# Patient Record
Sex: Female | Born: 1985 | Race: White | Hispanic: No | Marital: Married | Smoking: Current every day smoker
Health system: Southern US, Community
[De-identification: ages and names within clinical notes are randomized; demographics above are authoritative.]

---

## 2013-07-25 ENCOUNTER — Emergency Department (HOSPITAL_COMMUNITY)
Admission: EM | Admit: 2013-07-25 | Discharge: 2013-07-25 | Disposition: A | Discharge status: Home / Self Care | Payer: Self-pay | Attending: Emergency Medicine | Admitting: Emergency Medicine

## 2013-07-25 ENCOUNTER — Encounter (HOSPITAL_COMMUNITY): Payer: Self-pay | Admitting: Emergency Medicine

## 2013-07-25 DIAGNOSIS — Z79899 Other long term (current) drug therapy: Secondary | ICD-10-CM | POA: Insufficient documentation

## 2013-07-25 DIAGNOSIS — F17200 Nicotine dependence, unspecified, uncomplicated: Secondary | ICD-10-CM | POA: Insufficient documentation

## 2013-07-25 DIAGNOSIS — J209 Acute bronchitis, unspecified: Secondary | ICD-10-CM | POA: Insufficient documentation

## 2013-07-25 DIAGNOSIS — J40 Bronchitis, not specified as acute or chronic: Secondary | ICD-10-CM

## 2013-07-25 MED ORDER — AZITHROMYCIN 250 MG PO TABS
500.0000 mg | ORAL_TABLET | Freq: Once | ORAL | Status: AC
  Administered 2013-07-25: 500 mg via ORAL
  Filled 2013-07-25: qty 2

## 2013-07-25 MED ORDER — AZITHROMYCIN 250 MG PO TABS: ORAL_TABLET | ORAL | Status: DC

## 2013-07-25 MED ORDER — ALBUTEROL SULFATE HFA 108 (90 BASE) MCG/ACT IN AERS: 2.0000 | INHALATION_SPRAY | RESPIRATORY_TRACT | Status: DC | PRN

## 2013-07-25 MED ORDER — ALBUTEROL SULFATE HFA 108 (90 BASE) MCG/ACT IN AERS
2.0000 | INHALATION_SPRAY | Freq: Once | RESPIRATORY_TRACT | Status: AC
  Administered 2013-07-25: 2 via RESPIRATORY_TRACT
  Filled 2013-07-25: qty 6.7

## 2013-07-25 NOTE — ED Notes (Signed)
Pt c/o URI sx with cough and congestion x 1 week

## 2013-07-25 NOTE — ED Provider Notes (Signed)
CSN: [id number]     Arrival date & time 07/25/13  1507 History  This chart was scribed for non-physician practitioner, Wylene Simmer PA-C,  working with Gilda Crease, * by Arlan Organ, ED Scribe. This patient was seen in room TR11C/TR11C and the patient's care was started at 3:53 PM.   Chief Complaint  Patient presents with  . URI  . Nasal Congestion  . Cough   Patient is a 27 y.o. female presenting with URI and cough. The history is provided by the patient. No language interpreter was used.  URI Presenting symptoms: congestion and cough   Severity:  Moderate Onset quality:  Gradual Duration:  1 week Timing:  Constant Progression:  Waxing and waning Chronicity:  Recurrent Relieved by:  OTC medications and inhaler Associated symptoms: wheezing   Cough Associated symptoms: wheezing   HPI Comments: Rhonda Love is a 27 y.o. female who presents to the Emergency Department complaining of a URI that started a week ago. Pt states she feels her wisdom teeth are cutting through her sinus canal, which result in frequent URI's. Pt states she has also experienced associated green and brown colored sputum and nasal congestion. She says she has tried Nyquil, tylenol, and albuterol with some relief. Pt states she is a smoker, but has not been smoking much in the past week.   History reviewed. No pertinent past medical history. History reviewed. No pertinent past surgical history. History reviewed. No pertinent family history. History  Substance Use Topics  . Smoking status: Current Every Day Smoker  . Smokeless tobacco: Not on file  . Alcohol Use: Yes     Comment: occ   OB History   Grav Para Term Preterm Abortions TAB SAB Ect Mult Living                 Review of Systems  HENT: Positive for congestion.   Respiratory: Positive for cough and wheezing.   All other systems reviewed and are negative.    Allergies  Review of patient's allergies indicates no known  allergies.  Home Medications   Current Outpatient Rx  Name  Route  Sig  Dispense  Refill  . acetaminophen (TYLENOL) 325 MG tablet   Oral   Take 650 mg by mouth every 6 (six) hours as needed for pain.         Marland Kitchen albuterol (PROVENTIL HFA;VENTOLIN HFA) 108 (90 BASE) MCG/ACT inhaler   Inhalation   Inhale 2 puffs into the lungs every 6 (six) hours as needed for wheezing.         Marland Kitchen DM-Doxylamine-Acetaminophen 15-6.25-325 MG/15ML LIQD   Oral   Take 30 mLs by mouth every 6 (six) hours as needed (for congestion).          . Multiple Vitamin (MULTIVITAMIN WITH MINERALS) TABS tablet   Oral   Take 1 tablet by mouth daily.          BP 134/86  Pulse 81  Temp(Src) 98.4 F (36.9 C) (Oral)  Resp 18  SpO2 97% Physical Exam  Nursing note and vitals reviewed. Constitutional: She is oriented to person, place, and time. She appears well-developed and well-nourished.  HENT:  Head: Normocephalic and atraumatic.  Right Ear: Tympanic membrane normal.  Left Ear: Tympanic membrane normal.  No frontal sinus pain No maxillary sinus pain No cerumen impaction Mild erythema  post pharynx Nasal mucosa boggy  Eyes: EOM are normal.  Neck: Normal range of motion.  Cardiovascular: Normal rate.   Pulmonary/Chest: Effort  normal. She has wheezes.  Left upper lobal expiratory wheeze noted  Musculoskeletal: Normal range of motion.  Lymphadenopathy:    She has no cervical adenopathy.  Neurological: She is alert and oriented to person, place, and time.  Skin: Skin is warm and dry.  Psychiatric: She has a normal mood and affect. Her behavior is normal.    ED Course  Procedures (including critical care time)  DIAGNOSTIC STUDIES: Oxygen Saturation is 97% on RA, Normal by my interpretation.    COORDINATION OF CARE: 4:32 PM- Will prescribed medication for bronchitis. Discussed treatment plan with pt at bedside and pt agreed to plan.      Labs Review Labs Reviewed - No data to display Imaging  Review No results found.  MDM  Bronchitis  Patient here with a week history of URI and sinus symptoms.  States has used friends inhaler and this felt better - no dysnpea or hypoxia noted here - will start on zithromax and inhaler.  I personally performed the services described in this documentation, which was scribed in my presence. The recorded information has been reviewed and is accurate.   Izola Price Marisue Humble, [place] 07/25/13 1634

## 2013-07-28 NOTE — ED Provider Notes (Signed)
Medical screening examination/treatment/procedure(s) were performed by non-physician practitioner and as supervising physician I was immediately available for consultation/collaboration.    Christopher J. Pollina, MD 07/28/13 0432 

## 2013-08-02 ENCOUNTER — Encounter (HOSPITAL_COMMUNITY): Payer: Self-pay | Admitting: Nurse Practitioner

## 2013-08-02 ENCOUNTER — Emergency Department (HOSPITAL_COMMUNITY)
Admission: EM | Admit: 2013-08-02 | Discharge: 2013-08-02 | Disposition: A | Discharge status: Home / Self Care | Payer: Self-pay | Attending: Emergency Medicine | Admitting: Emergency Medicine

## 2013-08-02 DIAGNOSIS — S0990XA Unspecified injury of head, initial encounter: Secondary | ICD-10-CM | POA: Insufficient documentation

## 2013-08-02 DIAGNOSIS — W010XXA Fall on same level from slipping, tripping and stumbling without subsequent striking against object, initial encounter: Secondary | ICD-10-CM | POA: Insufficient documentation

## 2013-08-02 DIAGNOSIS — W19XXXA Unspecified fall, initial encounter: Secondary | ICD-10-CM

## 2013-08-02 DIAGNOSIS — F17200 Nicotine dependence, unspecified, uncomplicated: Secondary | ICD-10-CM | POA: Insufficient documentation

## 2013-08-02 DIAGNOSIS — Y92009 Unspecified place in unspecified non-institutional (private) residence as the place of occurrence of the external cause: Secondary | ICD-10-CM | POA: Insufficient documentation

## 2013-08-02 DIAGNOSIS — Y9301 Activity, walking, marching and hiking: Secondary | ICD-10-CM | POA: Insufficient documentation

## 2013-08-02 DIAGNOSIS — Z79899 Other long term (current) drug therapy: Secondary | ICD-10-CM | POA: Insufficient documentation

## 2013-08-02 MED ORDER — IBUPROFEN 400 MG PO TABS
800.0000 mg | ORAL_TABLET | Freq: Once | ORAL | Status: AC
  Administered 2013-08-02: 800 mg via ORAL
  Filled 2013-08-02: qty 2

## 2013-08-02 NOTE — ED Notes (Signed)
Pt slipped and struck left mastoid area on the stair rale. Denies LOC, no N/V. Slight bruising noted.

## 2013-08-02 NOTE — ED Notes (Signed)
Pt slid on wet floor and hit L side of head on railing. C/o pain at site now. No bruising noted. Denies loc. A&Ox4. Also requesting a pregnancy test.
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
## 2013-08-02 NOTE — ED Notes (Signed)
Pt  Discharged.Vital signs stable and GCS 15

## 2013-08-03 NOTE — ED Provider Notes (Signed)
Medical screening examination/treatment/procedure(s) were performed by non-physician practitioner and as supervising physician I was immediately available for consultation/collaboration.   Junius Argyle, MD 08/03/13 1220

## 2013-09-14 DIAGNOSIS — Z880 Allergy status to penicillin: Secondary | ICD-10-CM | POA: Insufficient documentation

## 2013-09-14 DIAGNOSIS — R110 Nausea: Secondary | ICD-10-CM | POA: Insufficient documentation

## 2013-09-14 DIAGNOSIS — Z3202 Encounter for pregnancy test, result negative: Secondary | ICD-10-CM | POA: Insufficient documentation

## 2013-09-14 DIAGNOSIS — N946 Dysmenorrhea, unspecified: Secondary | ICD-10-CM | POA: Insufficient documentation

## 2013-09-14 DIAGNOSIS — F17200 Nicotine dependence, unspecified, uncomplicated: Secondary | ICD-10-CM | POA: Insufficient documentation

## 2013-09-14 NOTE — ED Notes (Signed)
Pt. reports intermittent vaginal bleeding/clots with low abdominal cramping onset Nov. 2 last week , denies injury or urinary discomfort.

## 2013-09-15 ENCOUNTER — Emergency Department (HOSPITAL_COMMUNITY): Payer: Self-pay

## 2013-09-15 ENCOUNTER — Emergency Department (HOSPITAL_COMMUNITY)
Admission: EM | Admit: 2013-09-15 | Discharge: 2013-09-15 | Disposition: A | Discharge status: Home / Self Care | Payer: Self-pay | Attending: Emergency Medicine | Admitting: Emergency Medicine

## 2013-09-15 ENCOUNTER — Encounter (HOSPITAL_COMMUNITY): Payer: Self-pay | Admitting: Emergency Medicine

## 2013-09-15 DIAGNOSIS — N946 Dysmenorrhea, unspecified: Secondary | ICD-10-CM

## 2013-09-15 LAB — URINE MICROSCOPIC-ADD ON

## 2013-09-15 LAB — POCT I-STAT, CHEM 8
BUN: 13 mg/dL (ref 6–23)
Calcium, Ion: 1.21 mmol/L (ref 1.12–1.23)
Creatinine, Ser: 1 mg/dL (ref 0.50–1.10)
Glucose, Bld: 91 mg/dL (ref 70–99)
Hemoglobin: 13.9 g/dL (ref 12.0–15.0)
Potassium: 3.3 mEq/L — ABNORMAL LOW (ref 3.5–5.1)
Sodium: 139 mEq/L (ref 135–145)

## 2013-09-15 LAB — URINALYSIS, ROUTINE W REFLEX MICROSCOPIC
Bilirubin Urine: NEGATIVE
Glucose, UA: NEGATIVE mg/dL
Protein, ur: NEGATIVE mg/dL
Specific Gravity, Urine: 1.035 — ABNORMAL HIGH (ref 1.005–1.030)
Urobilinogen, UA: 1 mg/dL (ref 0.0–1.0)

## 2013-09-15 LAB — WET PREP, GENITAL: Trich, Wet Prep: NONE SEEN

## 2013-09-15 MED ORDER — TRAMADOL HCL 50 MG PO TABS: 50.0000 mg | ORAL_TABLET | Freq: Four times a day (QID) | ORAL | Status: DC | PRN

## 2013-09-15 MED ORDER — OXYCODONE-ACETAMINOPHEN 5-325 MG PO TABS
2.0000 | ORAL_TABLET | Freq: Once | ORAL | Status: AC
  Administered 2013-09-15: 2 via ORAL
  Filled 2013-09-15: qty 2

## 2013-09-15 NOTE — ED Provider Notes (Signed)
CSN: [id number]     Arrival date & time 09/14/13  2334 History   First MD Initiated Contact with Patient 09/15/13 0315     Chief Complaint  Patient presents with  . Vaginal Bleeding  . Abdominal Cramping   (Consider location/radiation/quality/duration/timing/severity/associated sxs/prior Treatment) HPI Comments: Patient is a 27 y/o female who presents for intermittent vaginal bleeding with associated lower abdominal pain. Patient states that one week ago she experienced you can see vaginal discharge next with dark brown/red blood. This persisted for 4 days before spontaneously resolving. Patient states she went 2 days without symptoms and then noticed a large red clot in the toilet bowel after urinating 3 days ago. Patient has been having intermittent vaginal bleeding since this time with abdominal pain that waxes and wanes in severity. Patient is to some mild associated nausea. She denies associated fever, chest pain or shortness of breath, dysuria or hematuria, diarrhea, melena or hematochezia, and numbness or tingling. Patient's last known menstrual period was 07/16/2013.  Patient is a 27 y.o. female presenting with vaginal bleeding and cramps. The history is provided by the patient. No language interpreter was used.  Vaginal Bleeding Associated symptoms: abdominal pain (cramping) and nausea   Associated symptoms: no dysuria and no fever   Abdominal Cramping Associated symptoms include abdominal pain (cramping) and nausea. Pertinent negatives include no chest pain, fever or vomiting.    History reviewed. No pertinent past medical history. Past Surgical History  Procedure Laterality Date  . Cesarean section  11/2011   No family history on file. History  Substance Use Topics  . Smoking status: Current Every Day Smoker  . Smokeless tobacco: Not on file  . Alcohol Use: Yes     Comment: occ   OB History   Grav Para Term Preterm Abortions TAB SAB Ect Mult Living                  Review of Systems  Constitutional: Negative for fever.  Respiratory: Negative for shortness of breath.   Cardiovascular: Negative for chest pain.  Gastrointestinal: Positive for nausea and abdominal pain (cramping). Negative for vomiting.  Genitourinary: Positive for vaginal bleeding. Negative for dysuria and hematuria.  All other systems reviewed and are negative.    Allergies  Penicillins  Home Medications   Current Outpatient Rx  Name  Route  Sig  Dispense  Refill  . Multiple Vitamin (MULTIVITAMIN WITH MINERALS) TABS tablet   Oral   Take 1 tablet by mouth daily.         . traMADol (ULTRAM) 50 MG tablet   Oral   Take 1 tablet (50 mg total) by mouth every 6 (six) hours as needed.   7 tablet   0    BP 87/67  Pulse 58  Temp(Src) 98.1 F (36.7 C) (Oral)  Resp 18  Wt 279 lb (126.554 kg)  SpO2 95%  LMP 07/16/2013  Physical Exam  Nursing note and vitals reviewed. Constitutional: She is oriented to person, place, and time. She appears well-developed and well-nourished. No distress.  HENT:  Head: Normocephalic and atraumatic.  Eyes: Conjunctivae and EOM are normal. Pupils are equal, round, and reactive to light. No scleral icterus.  Neck: Normal range of motion.  Cardiovascular: Normal rate, regular rhythm and normal heart sounds.   Pulmonary/Chest: Effort normal. No respiratory distress. She has no wheezes. She has no rales.  Abdominal: Soft. She exhibits no distension. There is tenderness (suprapubic). There is no rebound and no guarding.  No peritoneal  signs or evidence of acute surgical abdomen  Genitourinary: Vagina normal. There is no rash, tenderness, lesion or injury on the right labia. There is no rash, tenderness, lesion or injury on the left labia. Uterus is tender (mild). Cervix exhibits no motion tenderness, no discharge and no friability. Right adnexum displays no mass, no tenderness and no fullness. Left adnexum displays tenderness. Left adnexum displays  no mass and no fullness.  Musculoskeletal: Normal range of motion.  Neurological: She is alert and oriented to person, place, and time.  Skin: Skin is warm and dry. No rash noted. She is not diaphoretic. No erythema. No pallor.  Psychiatric: She has a normal mood and affect. Her behavior is normal.    ED Course  Procedures (including critical care time) Labs Review Labs Reviewed  WET PREP, GENITAL - Abnormal; Notable for the following:    Clue Cells Wet Prep HPF POC FEW (*)    WBC, Wet Prep HPF POC FEW (*)    All other components within normal limits  URINALYSIS, ROUTINE W REFLEX MICROSCOPIC - Abnormal; Notable for the following:    APPearance CLOUDY (*)    Specific Gravity, Urine 1.035 (*)    Hgb urine dipstick MODERATE (*)    Ketones, ur 15 (*)    All other components within normal limits  URINE MICROSCOPIC-ADD ON - Abnormal; Notable for the following:    Crystals CA OXALATE CRYSTALS (*)    All other components within normal limits  POCT I-STAT, CHEM 8 - Abnormal; Notable for the following:    Potassium 3.3 (*)    All other components within normal limits  GC/CHLAMYDIA PROBE AMP  POCT PREGNANCY, URINE   Imaging Review US Transvaginal Non-ob  09/15/2013   CLINICAL DATA:  Vaginal bleeding and abdominal pain.  EXAM: TRANSABDOMINAL AND TRANSVAGINAL ULTRASOUND OF PELVIS  TECHNIQUE: Both transabdominal and transvaginal ultrasound examinations of the pelvis were performed. Transabdominal technique was performed for global imaging of the pelvis including uterus, ovaries, adnexal regions, and pelvic cul-de-sac. It was necessary to proceed with endovaginal exam following the transabdominal exam to visualize the uterus and ovaries in greater detail.  COMPARISON:  None  FINDINGS: Uterus  Measurements: 10.2 x 3.7 x 6.0 cm. No fibroids or other mass visualized.  Endometrium  Thickness: 1.0 cm.  No focal abnormality visualized.  Right ovary  Measurements: 3.2 x 3.0 x 2.0 cm. Normal appearance/no  adnexal mass.  Left ovary  Measurements: 3.2 x 2.6 x 2.6 cm. Normal appearance/no adnexal mass.  Other findings  No free fluid seen within the pelvic cul-de-sac.  IMPRESSION: Unremarkable pelvic ultrasound.   Electronically Signed   By: Roanna Raider M.D.   On: 09/15/2013 05:09   US Pelvis Complete  09/15/2013   CLINICAL DATA:  Vaginal bleeding and abdominal pain.  EXAM: TRANSABDOMINAL AND TRANSVAGINAL ULTRASOUND OF PELVIS  TECHNIQUE: Both transabdominal and transvaginal ultrasound examinations of the pelvis were performed. Transabdominal technique was performed for global imaging of the pelvis including uterus, ovaries, adnexal regions, and pelvic cul-de-sac. It was necessary to proceed with endovaginal exam following the transabdominal exam to visualize the uterus and ovaries in greater detail.  COMPARISON:  None  FINDINGS: Uterus  Measurements: 10.2 x 3.7 x 6.0 cm. No fibroids or other mass visualized.  Endometrium  Thickness: 1.0 cm.  No focal abnormality visualized.  Right ovary  Measurements: 3.2 x 3.0 x 2.0 cm. Normal appearance/no adnexal mass.  Left ovary  Measurements: 3.2 x 2.6 x 2.6 cm. Normal appearance/no adnexal  mass.  Other findings  No free fluid seen within the pelvic cul-de-sac.  IMPRESSION: Unremarkable pelvic ultrasound.   Electronically Signed   By: Roanna Raider M.D.   On: 09/15/2013 05:09    EKG Interpretation   None       MDM   1. Dysmenorrhea    Patient presents for intermittent vaginal bleeding with lower abdominal cramping. Physical exam today significant for mild suprapubic tenderness without peritoneal signs or guarding. Patient on nontoxic appearing, hemodynamically stable, and afebrile. Workup today is unremarkable and urinalysis nonsuggestive of infection. Pelvic ultrasound ordered for further evaluation of symptoms today which is normal and without acute findings. Pain well controlled in ED with by mouth Percocet. Believes symptoms to be associated with  dysmenorrhea. Patient stable and appropriate for discharge with OB/GYN followup for further evaluation of her symptoms. Ultram prescribed as needed for pain control. Return precautions discussed and patient agreeable to plan with no unaddressed concerns.    Antony Madura, PA-C 09/16/13 0730

## 2013-09-15 NOTE — ED Notes (Signed)
Pt states that she has been having heavy vaginal bleeding for the past 2 weeks and she started having lighter bleeding today. Pt states that she had a clot that came out a couple of days ago and then had less pain in her pelvis. Pt worried about amount of blood loss without her regular period.

## 2013-09-15 NOTE — ED Notes (Signed)
Patient transported to Ultrasound 

## 2013-09-16 LAB — GC/CHLAMYDIA PROBE AMP: GC Probe RNA: NEGATIVE

## 2013-09-17 NOTE — ED Provider Notes (Signed)
Medical screening examination/treatment/procedure(s) were performed by non-physician practitioner and as supervising physician I was immediately available for consultation/collaboration.  Porshia Blizzard, MD 09/17/13 0328 

## 2013-10-14 ENCOUNTER — Ambulatory Visit (INDEPENDENT_AMBULATORY_CARE_PROVIDER_SITE_OTHER): Payer: Self-pay | Admitting: Medical

## 2013-10-14 ENCOUNTER — Encounter: Payer: Self-pay | Admitting: Medical

## 2013-10-14 VITALS — BP 143/99 | HR 98 | Temp 96.9°F | Ht 65.0 in | Wt 269.8 lb

## 2013-10-14 DIAGNOSIS — N926 Irregular menstruation, unspecified: Secondary | ICD-10-CM

## 2013-10-14 DIAGNOSIS — N946 Dysmenorrhea, unspecified: Secondary | ICD-10-CM

## 2013-10-14 DIAGNOSIS — E282 Polycystic ovarian syndrome: Secondary | ICD-10-CM

## 2013-10-14 LAB — CBC
HCT: 42 % (ref 36.0–46.0)
Hemoglobin: 14.6 g/dL (ref 12.0–15.0)
MCH: 30.2 pg (ref 26.0–34.0)
MCV: 86.8 fL (ref 78.0–100.0)
Platelets: 280 10*3/uL (ref 150–400)
RBC: 4.84 MIL/uL (ref 3.87–5.11)
WBC: 10.7 10*3/uL — ABNORMAL HIGH (ref 4.0–10.5)

## 2013-10-14 LAB — HEMOGLOBIN A1C: Mean Plasma Glucose: 100 mg/dL (ref <117)

## 2013-10-14 LAB — TSH: TSH: 2.635 u[IU]/mL (ref 0.350–4.500)

## 2013-10-14 MED ORDER — TRAMADOL HCL 50 MG PO TABS: 50.0000 mg | ORAL_TABLET | Freq: Four times a day (QID) | ORAL | Status: DC | PRN

## 2013-10-14 MED ORDER — METFORMIN HCL 500 MG PO TABS: 1000.0000 mg | ORAL_TABLET | Freq: Two times a day (BID) | ORAL | Status: AC

## 2013-10-14 NOTE — Patient Instructions (Addendum)
Polycystic Ovarian Syndrome Polycystic ovarian syndrome is a condition with a number of problems. One problem is with the ovaries. The ovaries are organs located in the female pelvis, on each side of the uterus. Usually, during the menstrual cycle, an egg is released from 1 ovary every month. This is called ovulation. When the egg is fertilized, it goes into the womb (uterus), which allows for the growth of a baby. The egg travels from the ovary through the fallopian tube to the uterus. The ovaries also make the hormones estrogen and progesterone. These hormones help the development of a woman's breasts, body shape, and body hair. They also regulate the menstrual cycle and pregnancy. Sometimes, cysts form in the ovaries. A cyst is a fluid-filled sac. On the ovary, different types of cysts can form. The most common type of ovarian cyst is called a functional or ovulation cyst. It is normal, and often forms during the normal menstrual cycle. Each month, a woman's ovaries grow tiny cysts that hold the eggs. When an egg is fully grown, the sac breaks open. This releases the egg. Then, the sac which released the egg from the ovary dissolves. In one type of functional cyst, called a follicle cyst, the sac does not break open to release the egg. It may actually continue to grow. This type of cyst usually disappears within 1 to 3 months.  One type of cyst problem with the ovaries is called Polycystic Ovarian Syndrome (PCOS). In this condition, many follicle cysts form, but do not rupture and produce an egg. This health problem can affect the following:  Menstrual cycle.  Heart.  Obesity.  Cancer of the uterus.  Fertility.  Blood vessels.  Hair growth (face and body) or baldness.  Hormones.  Appearance.  High blood pressure.  Stroke.  Insulin production.  Inflammation of the liver.  Elevated blood cholesterol and triglycerides. CAUSES   No one knows the exact cause of PCOS.  Women with  PCOS often have a mother or sister with PCOS. There is not yet enough proof to say this is inherited.  Many women with PCOS have a weight problem.  Researchers are looking at the relationship between PCOS and the body's ability to make insulin. Insulin is a hormone that regulates the change of sugar, starches, and other food into energy for the body's use, or for storage. Some women with PCOS make too much insulin. It is possible that the ovaries react by making too many female hormones, called androgens. This can lead to acne, excessive hair growth, weight gain, and ovulation problems.  Too much production of luteinizing hormone (LH) from the pituitary gland in the brain stimulates the ovary to produce too much female hormone (androgen). SYMPTOMS   Infrequent or no menstrual periods, and/or irregular bleeding.  Inability to get pregnant (infertility), because of not ovulating.  Increased growth of hair on the face, chest, stomach, back, thumbs, thighs, or toes.  Acne, oily skin, or dandruff.  Pelvic pain.  Weight gain or obesity, usually carrying extra weight around the waist.  Type 2 diabetes (this is the diabetes that usually does not need insulin).  High cholesterol.  High blood pressure.  Female-pattern baldness or thinning hair.  Patches of thickened and dark brown or black skin on the neck, arms, breasts, or thighs.  Skin tags, or tiny excess flaps of skin, in the armpits or neck area.  Sleep apnea (excessive snoring and breathing stops at times while asleep).  Deepening of the voice.  Gestational diabetes when pregnant.  Increased risk of miscarriage with pregnancy. DIAGNOSIS  There is no single test to diagnose PCOS.   Your caregiver will:  Take a medical history.  Perform a pelvic exam.  Perform an ultrasound.  Check your female and female hormone levels.  Measure glucose or sugar levels in the blood.  Do other blood tests.  If you are producing too many  female hormones, your caregiver will make sure it is from PCOS. At the physical exam, your caregiver will want to evaluate the areas of increased hair growth. Try to allow natural hair growth for a few days before the visit.  During a pelvic exam, the ovaries may be enlarged or swollen by the increased number of small cysts. This can be seen more easily by vaginal ultrasound or screening, to examine the ovaries and lining of the uterus (endometrium) for cysts. The uterine lining may become thicker, if there has not been a regular period. TREATMENT  Because there is no cure for PCOS, it needs to be managed to prevent problems. Treatments are based on your symptoms. Treatment is also based on whether you want to have a baby or whether you need contraception.  Treatment may include:  Progesterone hormone, to start a menstrual period.  Birth control pills, to make you have regular menstrual periods.  Medicines to make you ovulate, if you want to get pregnant.  Medicines to control your insulin.  Medicine to control your blood pressure.  Medicine and diet, to control your high cholesterol and triglycerides in your blood.  Surgery, making small holes in the ovary, to decrease the amount of female hormone production. This is done through a long, lighted tube (laparoscope), placed into the pelvis through a tiny incision in the lower abdomen. Your caregiver will go over some of the choices with you. WOMEN WITH PCOS HAVE THESE CHARACTERISTICS:  High levels of female hormones called androgens.  An irregular or no menstrual cycle.  May have many small cysts in their ovaries. PCOS is the most common hormonal reproductive problem in women of childbearing age. WHY DO WOMEN WITH PCOS HAVE TROUBLE WITH THEIR MENSTRUAL CYCLE? Each month, about 20 eggs start to mature in the ovaries. As one egg grows and matures, the follicle breaks open to release the egg, so it can travel through the fallopian tube for  fertilization. When the single egg leaves the follicle, ovulation takes place. In women with PCOS, the ovary does not make all of the hormones it needs for any of the eggs to fully mature. They may start to grow and accumulate fluid, but no one egg becomes large enough. Instead, some may remain as cysts. Since no egg matures or is released, ovulation does not occur and the hormone progesterone is not made. Without progesterone, a woman's menstrual cycle is irregular or absent. Also, the cysts produce female hormones, which continue to prevent ovulation.  Document Released: 02/14/2005 Document Revised: 01/13/2012 Document Reviewed: 04/08/2013 [facility] Patient Information 2014 [place], [place].   Metformin Taper Dosages: 500 mg in the morning x 1 week 500 mg in the morning and 500 mg in the afternoon x 1 week 1000 mg in the morning and 500 mg in the afternoon x 1 week 1000 mg in the morning and 1000 mg in the afternoon x 1 week

## 2013-10-14 NOTE — Progress Notes (Signed)
Patient ID: Rhonda Love, female   DOB: [birth year], 27 y.o.   MRN: [id number]  History:  Rhonda Love is a 27 y.o. G1P1001 who presents to clinic today for abnormal uterine bleeding. The patient states she had heavy bleeding most days x 3 weeks in November. The patient states that she had a history of similar bleeding patterns as a teenager and again just after the birth of her child. Patient states that she had difficulties conceiving with her last child and has been trying again now x 2 years without success. Patient is not bleeding today. She denies bleeding x 2 weeks. Patient denies pain, vaginal discharge, dizziness, weakness or fatigue today. The patient states that since the birth of her child she has a cycle q 60 days.   The following portions of the patient's history were reviewed and updated as appropriate: allergies, current medications, past family history, past medical history, past social history, past surgical history and problem list.  Review of Systems:  Pertinent items are noted in HPI.  Objective:  Physical Exam BP 143/99  Pulse 98  Temp(Src) 96.9 F (36.1 C) (Oral)  Ht 5\' 5"  (1.651 m)  Wt 269 lb 12.8 oz (122.38 kg)  BMI 44.90 kg/m2  LMP 09/05/2013 GENERAL: Well-developed, well-nourished female in no acute distress.  HEENT: Normocephalic, atraumatic. Hirsutism.  LUNGS: Normal rate. Clear to auscultation bilaterally.  HEART: Regular rate and rhythm with no adventitious sounds.  ABDOMEN: Soft, nontender, nondistended. No organomegaly. Normal bowel sounds appreciated in all quadrants.  EXTREMITIES: No cyanosis, clubbing, or edema PSYCH: Normal mood and affect   Labs and Imaging US Transvaginal Non-ob  09/15/2013   CLINICAL DATA:  Vaginal bleeding and abdominal pain.  EXAM: TRANSABDOMINAL AND TRANSVAGINAL ULTRASOUND OF PELVIS  TECHNIQUE: Both transabdominal and transvaginal ultrasound examinations of the pelvis were performed. Transabdominal technique was performed  for global imaging of the pelvis including uterus, ovaries, adnexal regions, and pelvic cul-de-sac. It was necessary to proceed with endovaginal exam following the transabdominal exam to visualize the uterus and ovaries in greater detail.  COMPARISON:  None  FINDINGS: Uterus  Measurements: 10.2 x 3.7 x 6.0 cm. No fibroids or other mass visualized.  Endometrium  Thickness: 1.0 cm.  No focal abnormality visualized.  Right ovary  Measurements: 3.2 x 3.0 x 2.0 cm. Normal appearance/no adnexal mass.  Left ovary  Measurements: 3.2 x 2.6 x 2.6 cm. Normal appearance/no adnexal mass.  Other findings  No free fluid seen within the pelvic cul-de-sac.  IMPRESSION: Unremarkable pelvic ultrasound.   Electronically Signed   By: Roanna Raider M.D.   On: 09/15/2013 05:09   US Pelvis Complete  09/15/2013   CLINICAL DATA:  Vaginal bleeding and abdominal pain.  EXAM: TRANSABDOMINAL AND TRANSVAGINAL ULTRASOUND OF PELVIS  TECHNIQUE: Both transabdominal and transvaginal ultrasound examinations of the pelvis were performed. Transabdominal technique was performed for global imaging of the pelvis including uterus, ovaries, adnexal regions, and pelvic cul-de-sac. It was necessary to proceed with endovaginal exam following the transabdominal exam to visualize the uterus and ovaries in greater detail.  COMPARISON:  None  FINDINGS: Uterus  Measurements: 10.2 x 3.7 x 6.0 cm. No fibroids or other mass visualized.  Endometrium  Thickness: 1.0 cm.  No focal abnormality visualized.  Right ovary  Measurements: 3.2 x 3.0 x 2.0 cm. Normal appearance/no adnexal mass.  Left ovary  Measurements: 3.2 x 2.6 x 2.6 cm. Normal appearance/no adnexal mass.  Other findings  No free fluid seen within the pelvic cul-de-sac.  IMPRESSION: Unremarkable pelvic ultrasound.   Electronically Signed   By: Roanna Raider M.D.   On: 09/15/2013 05:09    Assessment & Plan:  Assessment: PCOS, clinical  Plans: 1. FSH, LH, CBC, HgbA1C and TSH drawn today 2. Rx for  Metformin sent to patient's pharmacy with instructions to taper up to full dose given 3. Rx for Ultram refills PRN pain 4. Patient to return to [facility] clinic for follow-up in ~ 6 weeks  Freddi Starr, PA-C 10/14/2013 3:52 PM

## 2013-10-15 LAB — LUTEINIZING HORMONE: LH: 24.8 m[IU]/mL

## 2013-10-15 LAB — FOLLICLE STIMULATING HORMONE: FSH: 5.9 m[IU]/mL

## 2013-11-01 ENCOUNTER — Telehealth: Payer: Self-pay | Admitting: *Deleted

## 2013-11-01 NOTE — Telephone Encounter (Signed)
Rhonda Love called and left a message stating she was seen 10/14/13 and diagnosed with PCOS an dJulie, NP put her on metformin. States she has been having horrible side effects With this medicine and has been trying to give it time to get in her system. Wants to talk with someone  To see what is normal or not and if she should stop the medicine.

## 2013-11-01 NOTE — Telephone Encounter (Signed)
Discussed patient complaints with Dr. Burnice LoganKatrinka Blazing and called patient and advised her to not increase dosage until after end of 1 more week . Informed her those are all common side effects and usually as her body adjusts side effects will lessen. Also discussed taking meds with a meal which she is already doing and states that has helped.  Advised her to keep her follow up appt already scheduled and to call back sooner if she does not start tolerating the meds better.  Also advised her if she is having a lot of diarrhea may take otc lomotil  And to force fluids as much as possible if having diarrhea, vomiting.  Adonis also requested lab results which were given.

## 2013-11-22 ENCOUNTER — Other Ambulatory Visit: Payer: Self-pay | Admitting: Medical

## 2013-11-22 DIAGNOSIS — N946 Dysmenorrhea, unspecified: Secondary | ICD-10-CM

## 2013-11-22 MED ORDER — TRAMADOL HCL 50 MG PO TABS: 50.0000 mg | ORAL_TABLET | Freq: Four times a day (QID) | ORAL | Status: DC | PRN

## 2013-11-23 ENCOUNTER — Telehealth: Payer: Self-pay | Admitting: *Deleted

## 2013-11-23 NOTE — Telephone Encounter (Signed)
Will send to Raynelle FanningJulie to get written prescription as it can not be eprescribed.

## 2013-11-23 NOTE — Telephone Encounter (Addendum)
Rhonda Love called and left a message stating she sent a My Chart message to Rhonda Love to renew her tramadol and she said she would and it would be at my pharmacy today.  States it is not at her pharmacy, but  That 's ok, but wants to know if we could change her pharmacy from [facility] [address] [facility] [place]. Request a call back.  Per chart review prescription was printed.  Called [facility] [address] and they state they have not received that order.  Also called [facility] and they do not have the written prescription.will send to CashionJulie

## 2013-11-24 ENCOUNTER — Telehealth: Payer: Self-pay | Admitting: *Deleted

## 2013-11-24 NOTE — Telephone Encounter (Signed)
Rhonda Love called back- [facility] [name] and that I had been unable to reach her by phone, but had sent a MyChart message. She states she had not read her mychart today. She also verified she had not gotten written prescription for ultram.

## 2013-11-24 NOTE — Telephone Encounter (Signed)
[facility] [address] and verified they [facility] not [facility] and they have not filled this as a written prescripton. [facility] try to [facility].

## 2013-11-24 NOTE — Telephone Encounter (Signed)
Bonita QuinLinda,   I did attempt to send a refill for this patient's tramadol. I am wondering if this is not able to be sent by e-Rx anymore. There have been some changes as to what can be sent and what has to be picked up. If the patient would like the Rx we can print one for her and have her pick it up as long as we can confirm with her pharmacy that it was not filled the other day.   Thanks,   Raynelle FanningJulie

## 2013-11-24 NOTE — Telephone Encounter (Signed)
[facility]- prescription can not be eprescribed, can be called in. Called in prescription under Dr. Jolayne Pantheronstant because needed DEA number. Will try to send patient mychart nofification.

## 2013-11-24 NOTE — Telephone Encounter (Signed)
Discussed with Raynelle FanningJulie, she had tried to eprescribe this, if that did not go thru clinic may call in prescription -if that is not allowed- have another prescriber print /sign prescription and patient pick up.  Called patient 's 2 phone numbers and unable to leave a message at either. Called pharmacy and it is closed until 9am.

## 2013-11-26 ENCOUNTER — Ambulatory Visit (INDEPENDENT_AMBULATORY_CARE_PROVIDER_SITE_OTHER): Payer: Self-pay | Admitting: Medical

## 2013-11-26 ENCOUNTER — Encounter: Payer: Self-pay | Admitting: Medical

## 2013-11-26 VITALS — BP 136/93 | Ht 65.0 in | Wt 260.7 lb

## 2013-11-26 DIAGNOSIS — E282 Polycystic ovarian syndrome: Secondary | ICD-10-CM | POA: Insufficient documentation

## 2013-11-26 NOTE — Patient Instructions (Signed)
Polycystic Ovarian Syndrome Polycystic ovarian syndrome (PCOS) is a common hormonal disorder among women of reproductive age. Most women with PCOS grow many small cysts on their ovaries. PCOS can cause problems with your periods and make it difficult to get pregnant. It can also cause an increased risk of miscarriage with pregnancy. If left untreated, PCOS can lead to serious health problems, such as diabetes and heart disease. CAUSES The cause of PCOS is not fully understood, but genetics may be a factor. SIGNS AND SYMPTOMS   Infrequent or no menstrual periods.   Inability to get pregnant (infertility) because of not ovulating.   Increased growth of hair on the face, chest, stomach, back, thumbs, thighs, or toes.   Acne, oily skin, or dandruff.   Pelvic pain.   Weight gain or obesity, usually carrying extra weight around the waist.   Type 2 diabetes.   High cholesterol.   High blood pressure.   Female-pattern baldness or thinning hair.   Patches of thickened and dark brown or black skin on the neck, arms, breasts, or thighs.   Tiny excess flaps of skin (skin tags) in the armpits or neck area.   Excessive snoring and having breathing stop at times while asleep (sleep apnea).   Deepening of the voice.   Gestational diabetes when pregnant.  DIAGNOSIS  There is no single test to diagnose PCOS.   Your health care provider will:   Take a medical history.   Perform a pelvic exam.   Have ultrasonography done.   Check your female and female hormone levels.   Measure glucose or sugar levels in the blood.   Do other blood tests.   If you are producing too many female hormones, your health care provider will make sure it is from PCOS. At the physical exam, your health care provider will want to evaluate the areas of increased hair growth. Try to allow natural hair growth for a few days before the visit.   During a pelvic exam, the ovaries may be enlarged  or swollen because of the increased number of small cysts. This can be seen more easily by using vaginal ultrasonography or screening to examine the ovaries and lining of the uterus (endometrium) for cysts. The uterine lining may become thicker if you have not been having a regular period.  TREATMENT  Because there is no cure for PCOS, it needs to be managed to prevent problems. Treatments are based on your symptoms. Treatment is also based on whether you want to have a baby or whether you need contraception.  Treatment may include:   Progesterone hormone to start a menstrual period.   Birth control pills to make you have regular menstrual periods.   Medicines to make you ovulate, if you want to get pregnant.   Medicines to control your insulin.   Medicine to control your blood pressure.   Medicine and diet to control your high cholesterol and triglycerides in your blood.  Medicine to reduce excessive hair growth.  Surgery, making small holes in the ovary, to decrease the amount of female hormone production. This is done through a long, lighted tube (laparoscope) placed into the pelvis through a tiny incision in the lower abdomen.  HOME CARE INSTRUCTIONS  Only take over-the-counter or prescription medicine as directed by your health care provider.  Pay attention to the foods you eat and your activity levels. This can help reduce the effects of PCOS.  Keep your weight under control.  Eat foods that are   low in carbohydrate and high in fiber.  Exercise regularly. SEEK MEDICAL CARE IF:  Your symptoms do not get better with medicine.  You have new symptoms. Document Released: 02/14/2005 Document Revised: 08/11/2013 Document Reviewed: 04/08/2013 [employer] Patient Information 2014 [employer].  

## 2013-11-26 NOTE — Progress Notes (Signed)
Patient ID: Rhonda Love, female   DOB: 1985-11-18, 28 y.o.   MRN: [id number]  Rhonda Love is a 28 y.o. 871P1001 female presenting for follow-up on PCOS.  States she has been having GI side effects since being placed on Metformin at last visit in December - including nausea, vomiting, diarrhea, and weakness.  Vomiting all morning so was not able to take Metformin.  She has tried eating with medication, improving diet and taking Peptobismol with some relief.  Also started taking B12.  Denies pelvic pain today.  States Ultram is helping with pain control - describes only taking it when in pain, estimates taking a dose less than a few times a week.  States she had heavy bleeding in November, small vaginal spotting in December, and her LMP started on 1/16 and lasted for 5 days with heavy flow.  Content that it only lasted for 5 days.  Patient's last PAP smear was during her pregnancy in 2013.  Desires a PAP smear today.  Reports her two sisters may have had Cervical Cancer.  Desires to be pregnant again; has been trying x 2 years.  Monitoring body temp for ovulation.  Does want to d/c metformin as she has heard good things re: helping get pregnant.  There are no active problems to display for this patient.  History reviewed. No pertinent past medical history.  Current Outpatient Prescriptions on File Prior to Visit  Medication Sig Dispense Refill  . metFORMIN (GLUCOPHAGE) 500 MG tablet Take 2 tablets (1,000 mg total) by mouth 2 (two) times daily with a meal.  120 tablet  3  . Multiple Vitamin (MULTIVITAMIN WITH MINERALS) TABS tablet Take 1 tablet by mouth daily.      . traMADol (ULTRAM) 50 MG tablet Take 1 tablet (50 mg total) by mouth every 6 (six) hours as needed.  20 tablet  0   No current facility-administered medications on file prior to visit.   Allergies  Allergen Reactions  . Penicillins Rash   ROS: All pertinent positives and negative mentioned in the HPI  Physical Exam Filed Vitals:   11/26/13 1003  BP: 136/93  Height: 5\' 5"  (1.651 m)  Weight: 118.253 kg (260 lb 11.2 oz)   General:  Obese  female in no acute distress. HEENT:  Normocephalic, atraumatic.  Moist mucous membranes. Cardiovascular:  S1 and S2 are distinct. Respiratory:  CTAB. Abdomen:  Non-distended.  Diffusely tender to palpation.  No organomegaly appreciated.  +BS.   Skin:  Facial hirsutism.  Assessment & Plan #27 y.o. female 11P1001 - patient education re: new guidelines on PAP smear #PCOS - metformin intolerance at 4 pills/daily, but able to tolerate 3 pills/daily; comfortable with continuing Metformin at 3 pills daily; three cycles in last four months; will move to Provera if patient is not cycling 4x/year; RTC in 3 months for follow-up #Infertility - discussed need for infertility specialist in future if these interventions are not successful; recommended healthy eating and regular exercise; discussed using ovulation kits #Abdominal Pain - soreness 2/2 vomiting this morning  Rhonda Love L 11/26/2013 11:28 AM  I have seen and evaluated the patient with the PA student. I agree with the assessment and plan as written above.   Freddi StarrJulie N Ethier, PA-C 11/26/2013 11:28 AM

## 2013-12-07 ENCOUNTER — Encounter: Payer: Self-pay | Admitting: Medical

## 2013-12-23 ENCOUNTER — Other Ambulatory Visit: Payer: Self-pay | Admitting: Medical

## 2013-12-23 DIAGNOSIS — N946 Dysmenorrhea, unspecified: Secondary | ICD-10-CM

## 2013-12-23 NOTE — Telephone Encounter (Signed)
Spoke with and patient thinks she is pregnant and does not want to take prescription.

## 2014-01-18 ENCOUNTER — Telehealth: Payer: Self-pay | Admitting: *Deleted

## 2014-01-18 NOTE — Telephone Encounter (Signed)
Pt called and informed that she has an urinary tract infection and request medication.  Called patient and informed she needs to come provide a sample for testing.  Patient states she cannot come now because her husband has their only vehicle at work.  States she may have to come to [facility], it has been bothering her for a couple of days and cannot take it any longer.

## 2014-02-23 ENCOUNTER — Telehealth: Payer: Self-pay | Admitting: *Deleted

## 2014-02-23 ENCOUNTER — Other Ambulatory Visit: Payer: Self-pay | Admitting: Medical

## 2014-02-23 DIAGNOSIS — E282 Polycystic ovarian syndrome: Secondary | ICD-10-CM

## 2014-02-23 NOTE — Telephone Encounter (Signed)
I do not see anything about vision changes or vision complaints as a possible side effect of Metformin. Can you please confirm with an attending in clinic? I will send another Rx for Tramadol to the patient's pharmacy. Thanks

## 2014-02-23 NOTE — Telephone Encounter (Signed)
Patient left message stating that she went to see her eye doctor and her vision has gotten worse. He asked her if she had started any new meds and she reported that we put her on Metformin. Her eye doctor asked her to call us to see if this was a possible problem with the Metformin.Patient also wanted to know if we can give her more tramadol.

## 2014-02-23 NOTE — Telephone Encounter (Signed)
That is not something that can be sent to pharmacy. Sorry. Please print Rx and have patient come and pick up for Tramadol 1 tab q 6 hours PRN for pain #20. Thanks.

## 2014-02-24 ENCOUNTER — Other Ambulatory Visit: Payer: Self-pay | Admitting: Medical

## 2014-02-24 MED ORDER — TRAMADOL HCL 50 MG PO TABS: 50.0000 mg | ORAL_TABLET | Freq: Four times a day (QID) | ORAL | Status: AC | PRN

## 2014-02-24 NOTE — Telephone Encounter (Signed)
Spoke with Dr. Marice Potterove about the metfomin and she states that she is not aware of any vision side effects on metformin. Attempted to call patient but unable to get in touch with her. Will send mychart message.

## 2014-02-24 NOTE — Telephone Encounter (Signed)
rx called into pharmacy

## 2014-02-24 NOTE — Telephone Encounter (Signed)
Rx printed. Will discuss with Dr. Erin FullingHarraway-Smith this afternoon in clinic and call patient.

## 2014-09-05 ENCOUNTER — Encounter: Payer: Self-pay | Admitting: Medical

## 2014-11-26 IMAGING — US US PELVIS COMPLETE
1 series · 14 of 25 positions shown · non-contrast
Comparison: None

CLINICAL DATA: Vaginal bleeding and abdominal pain.

EXAM:
TRANSABDOMINAL AND TRANSVAGINAL ULTRASOUND OF PELVIS
TECHNIQUE: Both transabdominal and transvaginal ultrasound examinations of the
pelvis were performed. Transabdominal technique was performed for
global imaging of the pelvis including uterus, ovaries, adnexal
regions, and pelvic cul-de-sac. It was necessary to proceed with
endovaginal exam following the transabdominal exam to visualize the
uterus and ovaries in greater detail.

[Series 1: us pelvis complete · 0.24mm/px · 14 of 44 slices shown]
[im 1/44]
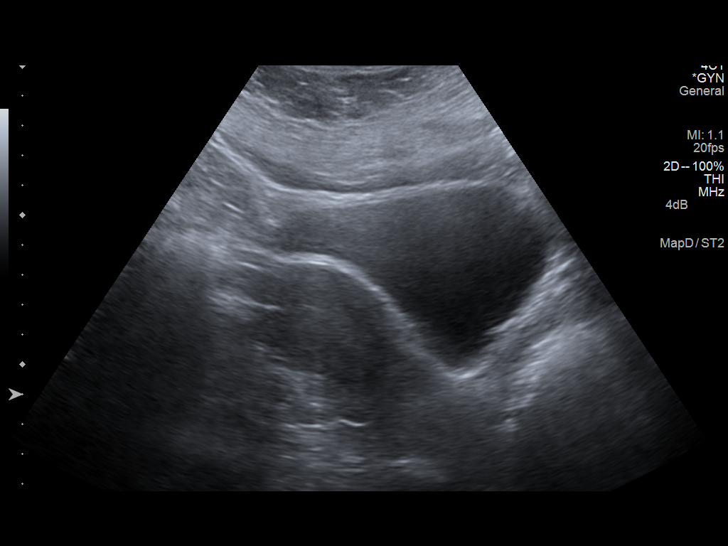
[im 4/44]
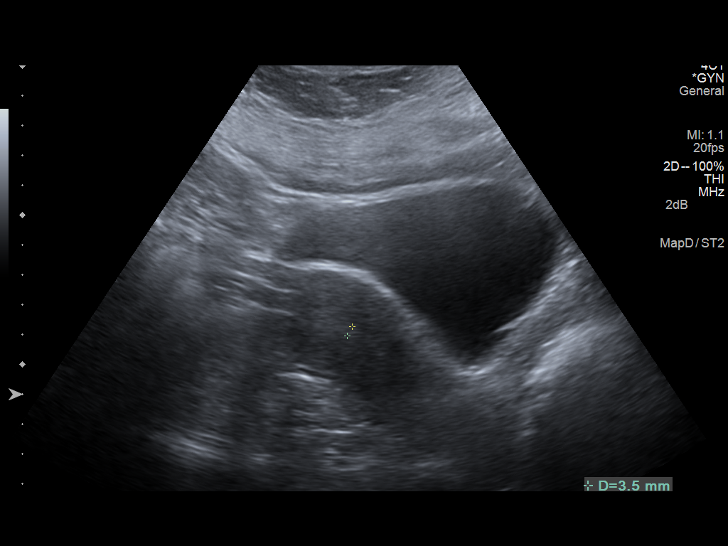
[im 8/44]
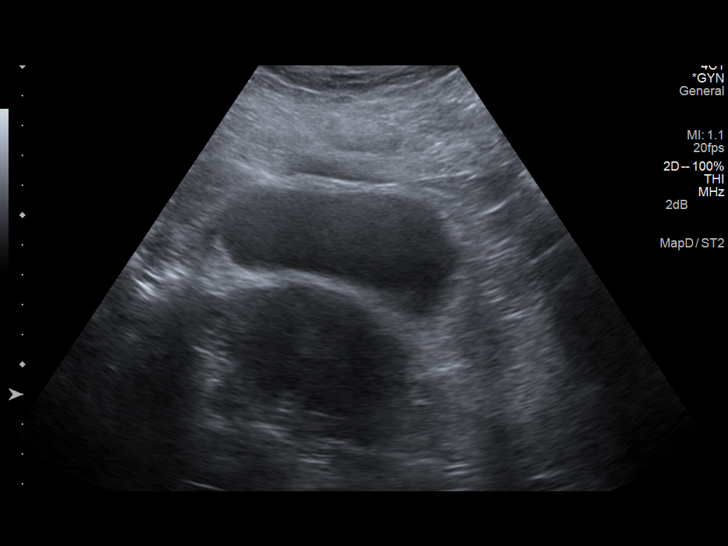
[im 11/44]
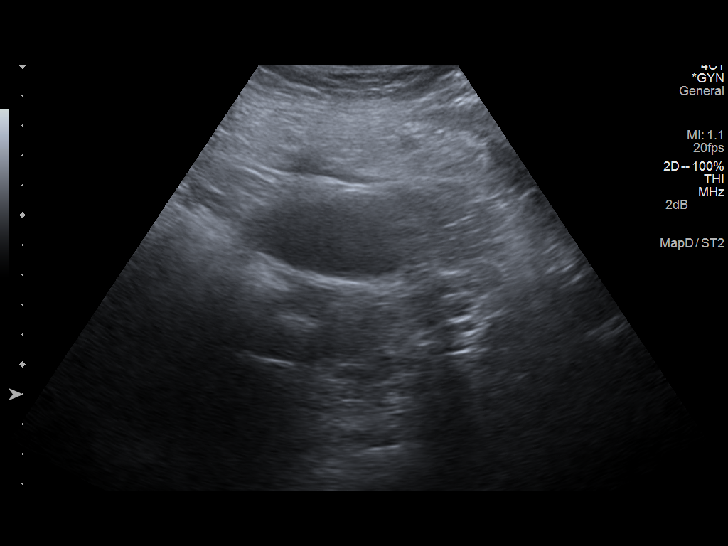
[im 15/44]
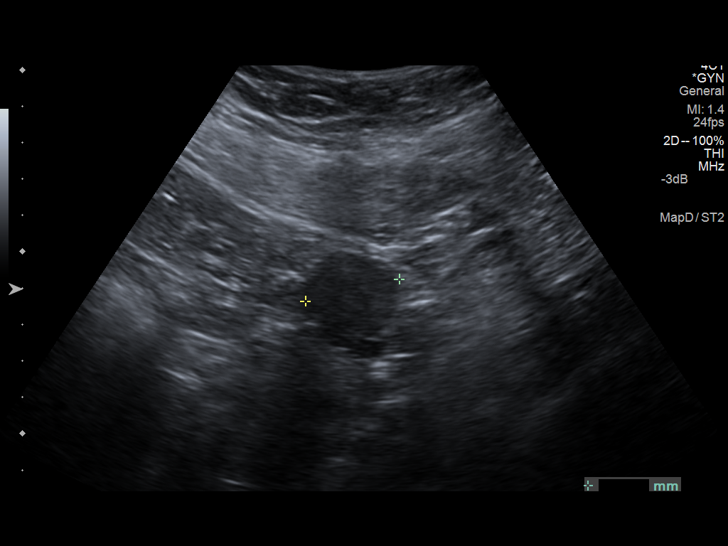
[im 17/44]
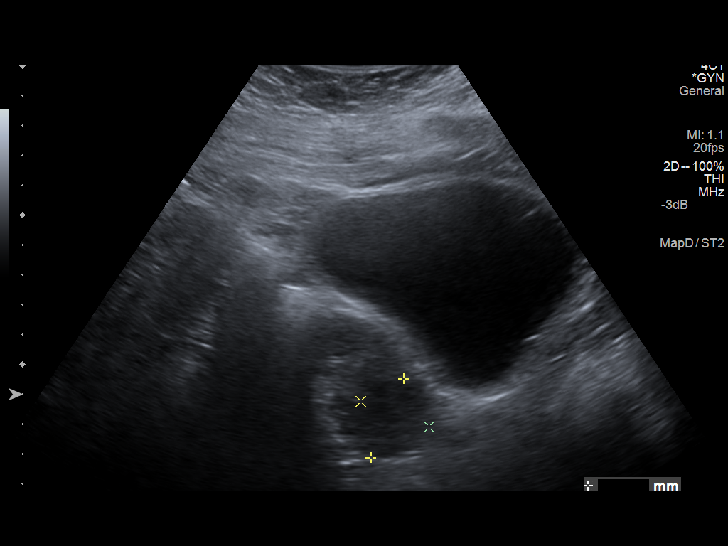
[im 20/44]
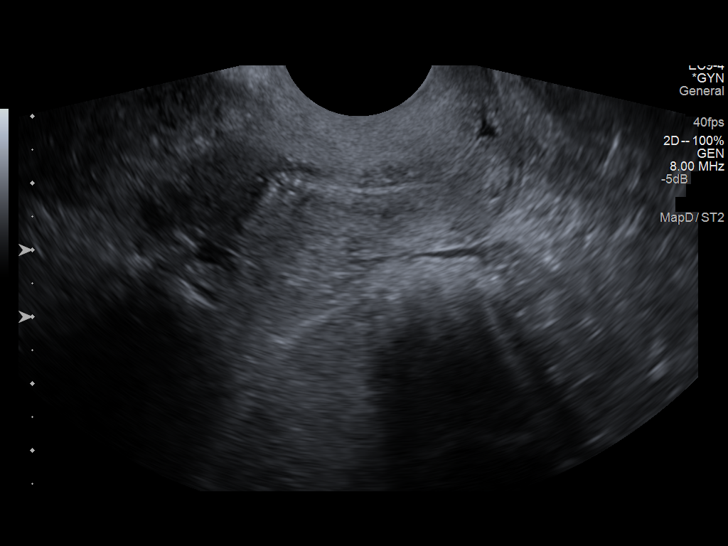
[im 24/44]
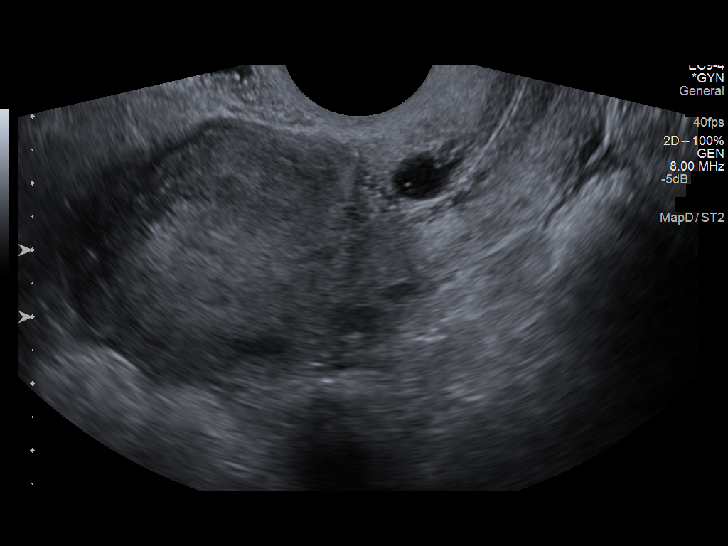
[im 27/44]
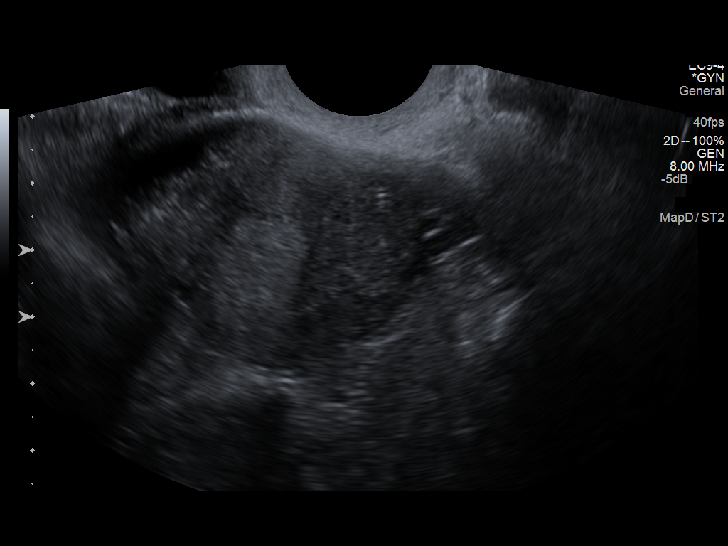
[im 29/44]
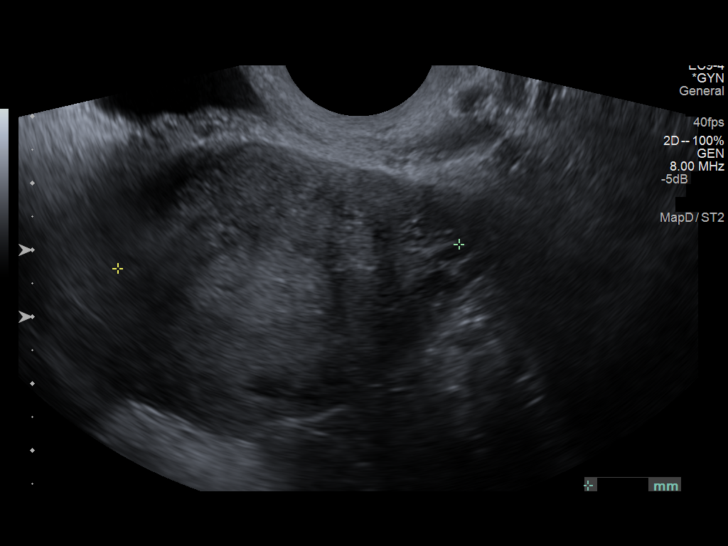
[im 33/44]
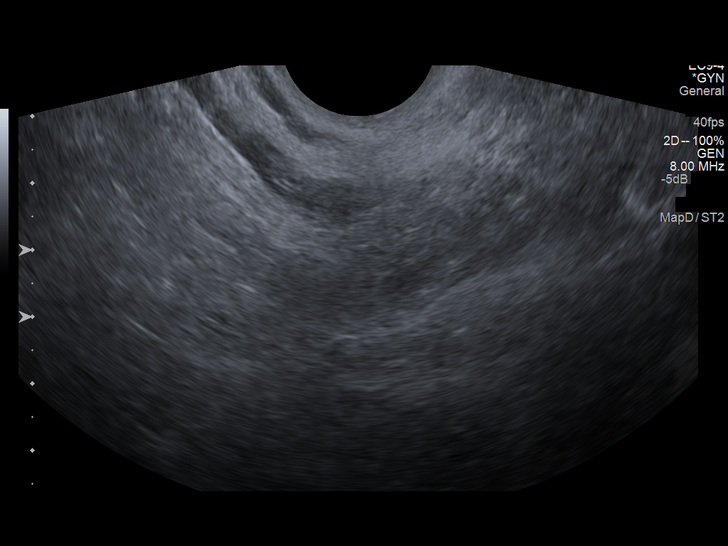
[im 36/44]
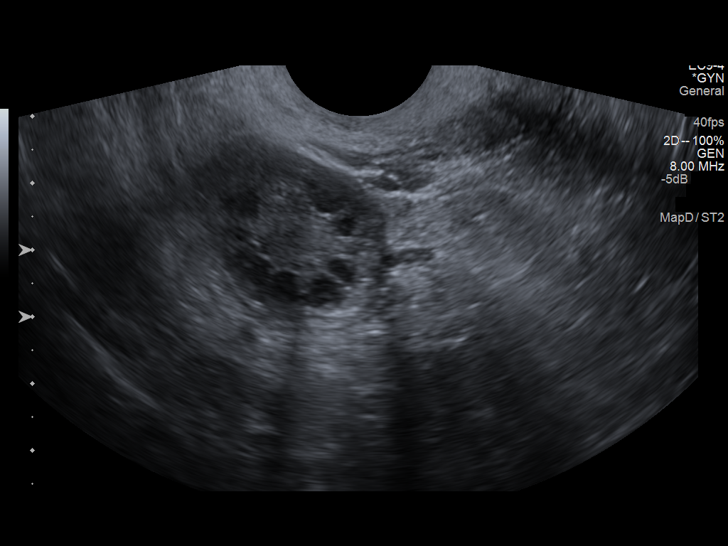
[im 40/44]
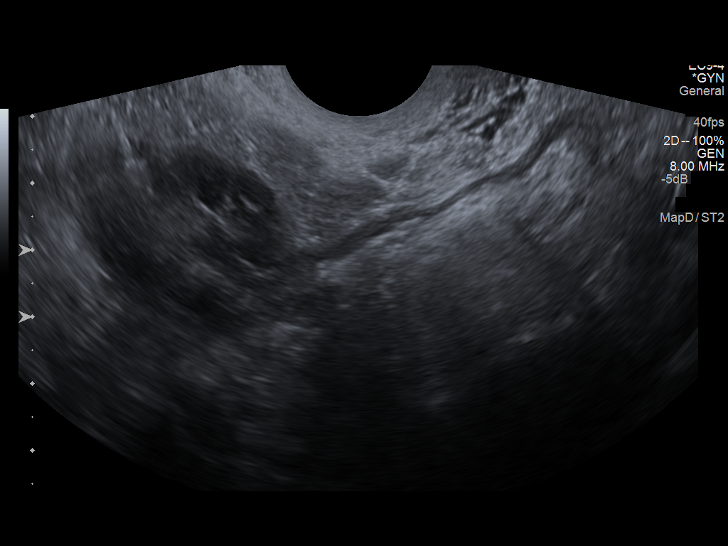
[im 44/44]
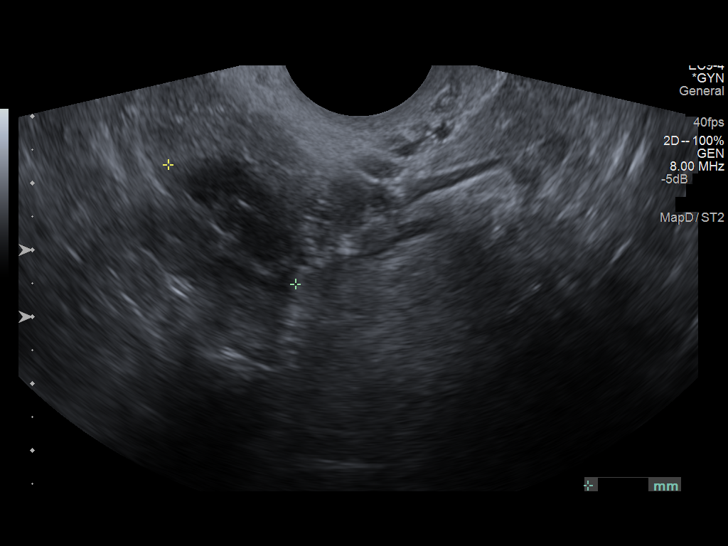

[14 of 25 positions shown; findings below may reference images not displayed]

FINDINGS: Uterus

Measurements: 10.2 x 3.7 x 6.0 cm. No fibroids or other mass
visualized.

Endometrium

Thickness: 1.0 cm.  No focal abnormality visualized.

Right ovary

Measurements: 3.2 x 3.0 x 2.0 cm. Normal appearance/no adnexal mass.

Left ovary

Measurements: 3.2 x 2.6 x 2.6 cm. Normal appearance/no adnexal mass.

Other findings

No free fluid seen within the pelvic cul-de-sac.
IMPRESSION: Unremarkable pelvic ultrasound.
